# Patient Record
Sex: Female | Born: 1970 | Race: White | Hispanic: No | Marital: Single | Smoking: Former smoker
Health system: Southern US, Community
[De-identification: ages and names within clinical notes are randomized; demographics above are authoritative.]

## PROBLEM LIST (undated history)

## (undated) DIAGNOSIS — G709 Myoneural disorder, unspecified: Secondary | ICD-10-CM

## (undated) DIAGNOSIS — N809 Endometriosis, unspecified: Secondary | ICD-10-CM

## (undated) DIAGNOSIS — F329 Major depressive disorder, single episode, unspecified: Secondary | ICD-10-CM

## (undated) DIAGNOSIS — E079 Disorder of thyroid, unspecified: Secondary | ICD-10-CM

## (undated) DIAGNOSIS — F32A Depression, unspecified: Secondary | ICD-10-CM

## (undated) DIAGNOSIS — N189 Chronic kidney disease, unspecified: Secondary | ICD-10-CM

## (undated) HISTORY — DX: Major depressive disorder, single episode, unspecified: F32.9

## (undated) HISTORY — DX: Myoneural disorder, unspecified: G70.9

## (undated) HISTORY — PX: ABDOMINAL HYSTERECTOMY: SHX81

## (undated) HISTORY — DX: Depression, unspecified: F32.A

## (undated) HISTORY — DX: Disorder of thyroid, unspecified: E07.9

## (undated) HISTORY — DX: Chronic kidney disease, unspecified: N18.9

---

## 1998-02-03 ENCOUNTER — Inpatient Hospital Stay (HOSPITAL_COMMUNITY): Admission: AD | Admit: 1998-02-03 | Discharge: 1998-02-03 | Payer: Self-pay | Admitting: Obstetrics and Gynecology

## 1998-02-05 ENCOUNTER — Inpatient Hospital Stay (HOSPITAL_COMMUNITY): Admission: AD | Admit: 1998-02-05 | Discharge: 1998-02-05 | Payer: Self-pay | Admitting: Obstetrics & Gynecology

## 1998-02-06 ENCOUNTER — Ambulatory Visit (HOSPITAL_COMMUNITY): Admission: RE | Admit: 1998-02-06 | Discharge: 1998-02-06 | Payer: Self-pay | Admitting: Obstetrics and Gynecology

## 1998-03-07 ENCOUNTER — Inpatient Hospital Stay (HOSPITAL_COMMUNITY): Admission: AD | Admit: 1998-03-07 | Discharge: 1998-03-07 | Payer: Self-pay | Admitting: Obstetrics and Gynecology

## 1998-07-22 ENCOUNTER — Inpatient Hospital Stay (HOSPITAL_COMMUNITY): Admission: AD | Admit: 1998-07-22 | Discharge: 1998-07-22 | Payer: Self-pay | Admitting: Obstetrics and Gynecology

## 1998-07-23 ENCOUNTER — Inpatient Hospital Stay (HOSPITAL_COMMUNITY): Admission: AD | Admit: 1998-07-23 | Discharge: 1998-07-23 | Payer: Self-pay | Admitting: Obstetrics and Gynecology

## 1998-08-02 ENCOUNTER — Inpatient Hospital Stay (HOSPITAL_COMMUNITY): Admission: AD | Admit: 1998-08-02 | Discharge: 1998-08-05 | Payer: Self-pay | Admitting: Obstetrics and Gynecology

## 1998-09-02 ENCOUNTER — Other Ambulatory Visit: Admission: RE | Admit: 1998-09-02 | Discharge: 1998-09-02 | Payer: Self-pay | Admitting: Obstetrics and Gynecology

## 2002-07-13 ENCOUNTER — Emergency Department (HOSPITAL_COMMUNITY): Admission: EM | Admit: 2002-07-13 | Discharge: 2002-07-13 | Payer: Self-pay | Admitting: Emergency Medicine

## 2002-07-14 ENCOUNTER — Emergency Department (HOSPITAL_COMMUNITY): Admission: EM | Admit: 2002-07-14 | Discharge: 2002-07-14 | Payer: Self-pay | Admitting: Emergency Medicine

## 2002-07-14 ENCOUNTER — Encounter: Payer: Self-pay | Admitting: Emergency Medicine

## 2002-07-15 ENCOUNTER — Ambulatory Visit (HOSPITAL_COMMUNITY): Admission: EM | Admit: 2002-07-15 | Discharge: 2002-07-15 | Payer: Self-pay | Admitting: Emergency Medicine

## 2003-02-06 ENCOUNTER — Other Ambulatory Visit: Admission: RE | Admit: 2003-02-06 | Discharge: 2003-02-06 | Payer: Self-pay | Admitting: Obstetrics and Gynecology

## 2004-03-12 LAB — HM COLONOSCOPY

## 2004-07-22 ENCOUNTER — Other Ambulatory Visit: Admission: RE | Admit: 2004-07-22 | Discharge: 2004-07-22 | Payer: Self-pay | Admitting: Obstetrics and Gynecology

## 2010-09-30 LAB — HM MAMMOGRAPHY

## 2011-03-13 ENCOUNTER — Other Ambulatory Visit (INDEPENDENT_AMBULATORY_CARE_PROVIDER_SITE_OTHER): Payer: BLUE CROSS/BLUE SHIELD

## 2011-03-13 ENCOUNTER — Encounter: Payer: Self-pay | Admitting: Internal Medicine

## 2011-03-13 ENCOUNTER — Ambulatory Visit (INDEPENDENT_AMBULATORY_CARE_PROVIDER_SITE_OTHER): Payer: BLUE CROSS/BLUE SHIELD | Admitting: Internal Medicine

## 2011-03-13 VITALS — BP 108/70 | HR 81 | Temp 98.8°F | Ht 65.0 in | Wt 127.0 lb

## 2011-03-13 DIAGNOSIS — E0500 Thyrotoxicosis with diffuse goiter without thyrotoxic crisis or storm: Secondary | ICD-10-CM

## 2011-03-13 DIAGNOSIS — N390 Urinary tract infection, site not specified: Secondary | ICD-10-CM

## 2011-03-13 DIAGNOSIS — G2581 Restless legs syndrome: Secondary | ICD-10-CM

## 2011-03-13 DIAGNOSIS — F32A Depression, unspecified: Secondary | ICD-10-CM

## 2011-03-13 DIAGNOSIS — F3289 Other specified depressive episodes: Secondary | ICD-10-CM

## 2011-03-13 DIAGNOSIS — F329 Major depressive disorder, single episode, unspecified: Secondary | ICD-10-CM

## 2011-03-13 LAB — CBC WITH DIFFERENTIAL/PLATELET
Basophils Absolute: 0 10*3/uL (ref 0.0–0.1)
Eosinophils Absolute: 0.2 10*3/uL (ref 0.0–0.7)
MCHC: 34.2 g/dL (ref 30.0–36.0)
MCV: 93.4 fl (ref 78.0–100.0)
Monocytes Absolute: 0.4 10*3/uL (ref 0.1–1.0)
Neutrophils Relative %: 57.6 % (ref 43.0–77.0)
Platelets: 181 10*3/uL (ref 150.0–400.0)

## 2011-03-13 LAB — POCT URINALYSIS DIPSTICK
Bilirubin, UA: NEGATIVE
Glucose, UA: NEGATIVE
Ketones, UA: NEGATIVE
Nitrite, UA: NEGATIVE
Protein, UA: NEGATIVE
pH, UA: 6.5

## 2011-03-13 LAB — COMPREHENSIVE METABOLIC PANEL
AST: 21 U/L (ref 0–37)
Albumin: 4.1 g/dL (ref 3.5–5.2)
Alkaline Phosphatase: 58 U/L (ref 39–117)
Calcium: 9.1 mg/dL (ref 8.4–10.5)
Chloride: 104 mEq/L (ref 96–112)
Glucose, Bld: 80 mg/dL (ref 70–99)
Potassium: 3.8 mEq/L (ref 3.5–5.1)
Sodium: 141 mEq/L (ref 135–145)
Total Protein: 7.4 g/dL (ref 6.0–8.3)

## 2011-03-13 MED ORDER — DULOXETINE HCL 30 MG PO CPEP: 30.0000 mg | ORAL_CAPSULE | Freq: Every day | ORAL | Status: DC

## 2011-03-13 MED ORDER — GABAPENTIN ENACARBIL ER 600 MG PO TBCR: 1.0000 | EXTENDED_RELEASE_TABLET | Freq: Every evening | ORAL | Status: AC | PRN

## 2011-03-13 NOTE — Patient Instructions (Signed)
Depression, Adolescent and Adult Depression is a true and treatable medical condition. In general there are two kinds of depression:  Depression we all experience in some form. For example depression from the death of a loved one, financial distress or natural disasters will trigger or increase depression.   Clinical depression, on the other hand, appears without an apparent cause or reason. This depression is a disease. Depression may be caused by chemical imbalance in the body and brain or may come as a response to a physical illness. Alcohol and other drugs can cause depression.  DIAGNOSIS  The diagnosis of depression is usually based upon symptoms and medical history. TREATMENT Treatments for depression fall into three categories. These are:  Drug therapy. There are many medicines that treat depression. Responses may vary and sometimes trial and error is necessary to determine the best medicines and dosage for a particular patient.   Psychotherapy, also called talking treatments, helps people resolve their problems by looking at them from a different point of view and by giving people insight into their own personal makeup. Traditional psychotherapy looks at a childhood source of a problem. Other psychotherapy will look at current conflicts and move toward solving those. If the cause of depression is drug use, counseling is available to help abstain. In time the depression will usually improve. If there were underlying causes for the chemical use, they can be addressed.   ECT (electroconvulsive therapy) or shock treatment is not as commonly used today. It is a very effective treatment for severe suicidal depression. During ECT electrical impulses are applied to the head. These impulses cause a generalized seizure. It can be effective but causes a loss of memory for recent events. Sometimes this loss of memory may include the last several months.  Treat all depression or suicide threats as  serious. Obtain professional help. Do not wait to see if serious depression will get better over time without help. Seek help for yourself or those around you. In the [place] the number to the National Suicide Help Lines With 24 Hour Help Are: 1-800-SUICIDE 570 095 9221 Document Released: 11/13/2000 Document Re-Released: 02/12/2009 [facility] Patient Information 2011 [place], [place].Restless Legs Syndrome Restless legs syndrome is a sensori-motor (movement) disorder. The symptoms include uncomfortable sensations in the legs. These leg sensations are worse during periods of inactivity or rest. They are also worse while sitting or lying down. There is often a positive family history of the disorder. Individuals that have the disorder describe sensations of:  Pulling.  Drawing.   Crawling.   Worming.  Boring.   Tingling.   Pins and Needles.  Prickly.   Painful.   The sensations are usually accompanied by an overwhelming urge to move the legs. Sudden muscle jerks may also occur. Movement provides temporary relief from the discomfort. In rare cases, the arms may also be affected. Symptoms may interfere with sleep onset (sleep onset insomnia). Research suggests that restless legs syndrome is related to periodic limb movement disorder (PLMD). PLMD is another more common motor disorder. It also causes interrupted sleep. The symptoms often exhibit circadian rhythmicity in their peak occurrence during awakening hours. TREATMENT Treatment for restless legs syndrome is symptomatic. This means that the symptoms are treated. Massage and application of cold compresses may provide temporary relief. Medications effective in relieving the symptoms include:  Temazepam.  Levodopa/Carbidopa.   Bromocriptine.  Pergolide Mesylate.   Oxycodone.  Propoxyphene.   Codeine.  However, many of these medications have side effects. Current research suggests correction of  iron deficiency may improve symptoms for  some patients. PROGNOSIS Restless legs syndrome is a life-long condition. There is no cure. Symptoms may gradually worsen with age. The most disabling feature is the sleep onset insomnia. It can be severe. Document Released: 11/06/2002 Document Re-Released: 05/06/2010 [facility] Patient Information 2011 [place], [place].

## 2011-03-15 ENCOUNTER — Encounter: Payer: Self-pay | Admitting: Internal Medicine

## 2011-03-15 LAB — CULTURE, URINE COMPREHENSIVE
Colony Count: NO GROWTH
Organism ID, Bacteria: NO GROWTH

## 2011-03-15 MED ORDER — PROPYLTHIOURACIL 50 MG PO TABS: 50.0000 mg | ORAL_TABLET | Freq: Every day | ORAL | Status: AC

## 2011-03-15 NOTE — Progress Notes (Signed)
Subjective:    Patient ID: Katelyn Davis, female    DOB: January 31, 1971, 40 y.o.   MRN: [id number]  HPI New to me she needs a f/up on Grave's disease which was diagnosed by Thyroid scan and labs one year ago in [place]. She fells like she is doing well on PTU.  She thinks she may have a UTI with sone dysuria and frequency.  She has been under a lot of stress with a recent divorce and she feels persistently sad and depressed.  Also, she thinks he RLS as her legs twitch, jerk and move all night long. Sometimes they jerk so hard that it wakes her up during the night.    Review of Systems  Constitutional: Negative for fever, chills, diaphoresis, activity change, appetite change, fatigue and unexpected weight change.  HENT: Negative for sore throat, facial swelling, trouble swallowing, neck pain, neck stiffness and voice change.   Eyes: Negative for discharge, redness and itching.  Respiratory: Negative for apnea, cough, choking, chest tightness, shortness of breath, wheezing and stridor.   Cardiovascular: Negative for chest pain, palpitations and leg swelling.  Gastrointestinal: Negative for nausea, vomiting, abdominal pain, diarrhea, constipation, blood in stool and abdominal distention.  Genitourinary: Positive for dysuria and frequency. Negative for urgency, hematuria, flank pain, decreased urine volume, vaginal bleeding, vaginal discharge, enuresis, difficulty urinating, genital sores, vaginal pain, pelvic pain and dyspareunia.  Musculoskeletal: Negative for myalgias, back pain, joint swelling, arthralgias and gait problem.  Skin: Negative for color change, pallor, rash and wound.  Neurological: Negative for dizziness, tremors, seizures, syncope, facial asymmetry, speech difficulty, weakness, light-headedness, numbness and headaches.  Hematological: Negative for adenopathy. Does not bruise/bleed easily.  Psychiatric/Behavioral: Positive for dysphoric mood. Negative for suicidal ideas,  hallucinations, behavioral problems, confusion, sleep disturbance, self-injury, decreased concentration and agitation. The patient is nervous/anxious. The patient is not hyperactive.        Objective:   Physical Exam  Constitutional: She is oriented to person, place, and time. She appears well-developed and well-nourished. No distress.  HENT:  Head: Normocephalic.  Right Ear: External ear normal.  Left Ear: External ear normal.  Nose: Nose normal.  Mouth/Throat: Oropharynx is clear and moist. No oropharyngeal exudate.  Eyes: Conjunctivae and EOM are normal. Pupils are equal, round, and reactive to light. Right eye exhibits no discharge. Left eye exhibits no discharge. No scleral icterus.  Neck: Normal range of motion. Neck supple. No JVD present. No tracheal tenderness present. Carotid bruit is not present. No tracheal deviation present. No mass and no thyromegaly present.  Cardiovascular: Normal rate, regular rhythm, normal heart sounds and intact distal pulses.  Exam reveals no gallop and no friction rub.   No murmur heard. Pulmonary/Chest: Effort normal and breath sounds normal. No stridor. No respiratory distress. She has no wheezes. She has no rales. She exhibits no tenderness.  Abdominal: Soft. Bowel sounds are normal. She exhibits no distension and no mass. There is no tenderness. There is no rebound and no guarding.  Musculoskeletal: Normal range of motion. She exhibits no edema and no tenderness.  Lymphadenopathy:    She has no cervical adenopathy.  Neurological: She is alert and oriented to person, place, and time. She has normal reflexes. No cranial nerve deficit.  Skin: Skin is warm and dry. No rash noted. She is not diaphoretic. No erythema.  Psychiatric: Her speech is normal and behavior is normal. Judgment and thought content normal. Her mood appears anxious. Her affect is not angry, not blunt, not labile and not  inappropriate. She is not agitated, not aggressive, is not  hyperactive and not combative. Thought content is not paranoid and not delusional. Cognition and memory are normal. Cognition and memory are not impaired. She does not express impulsivity or inappropriate judgment. She exhibits a depressed mood. She expresses no homicidal and no suicidal ideation. She exhibits normal recent memory and normal remote memory.       She is tearful and sad.          Assessment & Plan:

## 2011-03-15 NOTE — Assessment & Plan Note (Signed)
I will check her TFT's and look for any toxic effects from PTU therapy

## 2011-03-16 ENCOUNTER — Encounter: Payer: Self-pay | Admitting: Internal Medicine

## 2011-03-16 DIAGNOSIS — N390 Urinary tract infection, site not specified: Secondary | ICD-10-CM | POA: Insufficient documentation

## 2011-03-16 NOTE — Assessment & Plan Note (Signed)
Start cymbalta, she is not willing to start psychotherapy at this time

## 2011-03-16 NOTE — Assessment & Plan Note (Signed)
I will check a urine culture and treat if needed

## 2011-03-16 NOTE — Assessment & Plan Note (Signed)
Try horizant, check labs to look for secondary causes

## 2011-03-20 ENCOUNTER — Ambulatory Visit: Payer: Self-pay | Admitting: Internal Medicine

## 2011-03-25 ENCOUNTER — Ambulatory Visit: Payer: Self-pay | Admitting: Internal Medicine

## 2011-04-13 ENCOUNTER — Ambulatory Visit: Payer: BLUE CROSS/BLUE SHIELD | Admitting: Internal Medicine

## 2011-04-17 NOTE — Op Note (Signed)
   TNAMELAURISA, SAHAKIAN                        ACCOUNT NO.:  [id number]   MEDICAL RECORD NO.:  [id number]                   PATIENT TYPE:  EMS   LOCATION:  ED                                   FACILITY:  [facility]   PHYSICIAN:  Maretta Bees. Vonita Moss, M.D.             DATE OF BIRTH:  1971-02-08   DATE OF PROCEDURE:  07/15/2002  DATE OF DISCHARGE:                                 OPERATIVE REPORT   PREOPERATIVE DIAGNOSIS:  Left upper ureteral stone with severe pain and  obstruction.   POSTOPERATIVE DIAGNOSIS:  Left upper ureteral stone with severe pain and  obstruction.   PROCEDURE:  1. Cystoscopy.  2. Left ureteroscopy with hormion laser fragmentation of stone.  3. Left retrograde pyelogram with interpretation.  4. Left double-J catheter placement.   SURGEON:  Maretta Bees. Vonita Moss, M.D.   ANESTHESIA:  General.   INDICATIONS:  This 40 year old white female has had severe left flank pain,  as noted in the ER note, due to the 5 mm stone in the mid upper ureter on  the left.  She requested endoscopic intervention at this time in view of her  severe pain and persistent problems, including nausea and vomiting.   DESCRIPTION OF PROCEDURE:  The patient was brought to operating room and  placed in lithotomy position.  The external genitalia were prepped and  draped in the usual fashion.  She was cystoscoped, and the bladder was  unremarkable.  A guidewire was passed up to the kidney with just some slight  hang up as it passed by the stone.  A ureteral dilating sheath was inserted,  and then the flexible ureteroscope inserted, and there was a lot of edema  and narrowing where the stone was located.  I was able to get above stone  and then using a hormion laser, fragmented the stone into several small  pieces.  I then passed the stone basket, but the fragments were too small to  engage in the basket.  I then injected contrast and outlined a somewhat  dilated pyelocaliceal system but an intact  upper ureter.  The guidewire was  backloaded through the cystoscope, and a 6 French 26 cm double-J catheter  placed with a full coil in the renal collecting system and a full coil in  the bladder and string brought out per urethra.  She was then taken to the  recovery room in good condition, having tolerated the procedure well.                                               Maretta Bees. Vonita Moss, M.D.    LJP/MEDQ  D:  07/15/2002  T:  07/15/2002  Job:  [id number]

## 2011-04-17 NOTE — H&P (Signed)
   Katelyn Davis, Katelyn Davis                         ACCOUNT NO.:  [id number]   MEDICAL RECORD NO.:  [id number]                   PATIENT TYPE:  EMS   LOCATION:  ED                                   FACILITY:  [facility]   PHYSICIAN:  Maretta Bees. Vonita Moss, M.D.             DATE OF BIRTH:  [birth year]   DATE OF ADMISSION:  07/15/2002  DATE OF DISCHARGE:                                HISTORY & PHYSICAL   HISTORY OF PRESENT ILLNESS:  This 40 year old white female was seen in the  emergency room here in the last two days for evaluation of severe left  abdominal pain.  The CT scan showed a 5 mm stone mid proximal ureter with  obstruction, and there was also felt to be another 3 to 4 mm stone in the  left kidney.  She was sent home on oxycodone, but she was so nauseated that  she could not keep the pain medication in.  When she called me this morning,  I told her to go back to the emergency room and I would see her there.   PAST MEDICAL HISTORY:  She is in generally good health.   MEDICATIONS:  She is on no regular medications.   ALLERGIES:  Denied.   She has had no stones before.   PHYSICAL EXAMINATION:  She was in extreme distress with severe left flank  pain, guarding in the left flank, but otherwise alert and healthy.   LABORATORY DATA:  A pregnancy test on 07/13/02, was negative.  She has had no  sexual activity since then.  __________ has been by isolation versus  endoscopic intervention.  They want to go ahead at this time, even as  opposed for waiting for ESL on Monday, 07/17/02.  The patient was advised of  ablation with Holmium laser and a double J catheter, and risks of  perforation etc.                                               Maretta Bees. Vonita Moss, M.D.    LJP/MEDQ  D:  07/15/2002  T:  07/15/2002  Job:  [id number]

## 2011-04-22 ENCOUNTER — Ambulatory Visit: Payer: BLUE CROSS/BLUE SHIELD | Admitting: Internal Medicine

## 2011-07-13 ENCOUNTER — Other Ambulatory Visit: Payer: Self-pay | Admitting: *Deleted

## 2011-07-13 DIAGNOSIS — F329 Major depressive disorder, single episode, unspecified: Secondary | ICD-10-CM

## 2011-07-13 MED ORDER — DULOXETINE HCL 30 MG PO CPEP: 30.0000 mg | ORAL_CAPSULE | Freq: Every day | ORAL | Status: AC

## 2011-07-13 MED ORDER — DULOXETINE HCL 30 MG PO CPEP: 30.0000 mg | ORAL_CAPSULE | Freq: Every day | ORAL | Status: DC

## 2011-07-13 NOTE — Progress Notes (Signed)
RX rf completed for mail order & samples avail for pick up. Pt aware

## 2011-09-14 ENCOUNTER — Ambulatory Visit: Payer: BLUE CROSS/BLUE SHIELD | Admitting: Internal Medicine

## 2011-09-14 DIAGNOSIS — Z0289 Encounter for other administrative examinations: Secondary | ICD-10-CM

## 2013-04-06 ENCOUNTER — Ambulatory Visit: Payer: BLUE CROSS/BLUE SHIELD | Admitting: Internal Medicine

## 2013-04-06 DIAGNOSIS — Z0289 Encounter for other administrative examinations: Secondary | ICD-10-CM

## 2013-04-11 ENCOUNTER — Ambulatory Visit: Payer: BLUE CROSS/BLUE SHIELD | Admitting: Family Medicine

## 2013-04-17 ENCOUNTER — Ambulatory Visit: Payer: BLUE CROSS/BLUE SHIELD | Admitting: Internal Medicine

## 2013-04-17 DIAGNOSIS — Z0289 Encounter for other administrative examinations: Secondary | ICD-10-CM

## 2015-03-28 ENCOUNTER — Other Ambulatory Visit: Payer: Self-pay

## 2018-01-24 ENCOUNTER — Other Ambulatory Visit: Payer: Self-pay

## 2018-01-28 LAB — CYTOLOGY - PAP: DIAGNOSIS: NEGATIVE

## 2018-10-21 ENCOUNTER — Other Ambulatory Visit (HOSPITAL_COMMUNITY): Payer: Self-pay | Admitting: *Deleted

## 2018-10-21 DIAGNOSIS — Z1231 Encounter for screening mammogram for malignant neoplasm of breast: Secondary | ICD-10-CM

## 2019-01-26 ENCOUNTER — Ambulatory Visit
Admission: RE | Admit: 2019-01-26 | Discharge: 2019-01-26 | Disposition: A | Discharge status: Home / Self Care | Payer: No Typology Code available for payment source | Source: Ambulatory Visit | Attending: Obstetrics and Gynecology | Admitting: Obstetrics and Gynecology

## 2019-01-26 ENCOUNTER — Ambulatory Visit (HOSPITAL_COMMUNITY)
Admission: RE | Admit: 2019-01-26 | Discharge: 2019-01-26 | Disposition: A | Discharge status: Home / Self Care | Payer: Self-pay | Source: Ambulatory Visit | Attending: Obstetrics and Gynecology | Admitting: Obstetrics and Gynecology

## 2019-01-26 ENCOUNTER — Encounter (HOSPITAL_COMMUNITY): Payer: Self-pay

## 2019-01-26 VITALS — BP 112/74

## 2019-01-26 DIAGNOSIS — Z1231 Encounter for screening mammogram for malignant neoplasm of breast: Secondary | ICD-10-CM

## 2019-01-26 DIAGNOSIS — Z1239 Encounter for other screening for malignant neoplasm of breast: Secondary | ICD-10-CM

## 2019-01-26 HISTORY — DX: Endometriosis, unspecified: N80.9

## 2019-01-26 NOTE — Progress Notes (Signed)
No complaints today.   Pap Smear: Pap smear not completed today. Last Pap smear was 01/24/2018 at the free cervical cancer screening at the [facility] and normal. Per patient has no history of an abnormal Pap smear. Patient has a history of a supracervical hysterectomy 3 years ago due to endometriosis. Last Pap smear result is in Epic.  Physical exam: Breasts Breasts symmetrical. No skin abnormalities bilateral breasts. No nipple retraction bilateral breasts. No nipple discharge bilateral breasts. No lymphadenopathy. No lumps palpated bilateral breasts. No complaints of pain or tenderness on exam. Referred patient to the [facility] for a screening mammogram. Appointment scheduled for Thursday, January 26, 2019 at 1530.        Pelvic/Bimanual No Pap smear completed today since last Pap smear was 01/24/2018. Pap smear not indicated per BCCCP guidelines.   Smoking History: Patient is a former smoker that quit in March 2018.  Patient Navigation: Patient education provided. Access to services provided for patient through BCCCP program.   Breast and Cervical Cancer Risk Assessment: Patient has no family history of breast cancer, known genetic mutations, or radiation treatment to the chest before age 89. Patient has no history of cervical dysplasia, immunocompromised, or DES exposure in-utero.  Risk Assessment    Risk Scores      01/26/2019   Last edited by: Lynnell Dike, LPN   5-year risk: 0.8 %   Lifetime risk: 8.5 %

## 2019-01-26 NOTE — Patient Instructions (Signed)
Explained breast self awareness with Katelyn Davis. Patient did not need a Pap smear today due to last Pap smear was 01/24/2018. Let her know BCCCP will cover Pap smears every 3 years unless has a history of abnormal Pap smears. Referred patient to the [facility] for a screening mammogram. Appointment scheduled for Thursday, January 26, 2019 at 1530. Patient aware of appointment and will be there. Let patient know the Breast Center will follow up with her within the next couple weeks with results of mammogram by letter or phone. Katelyn Davis verbalized understanding.  [place], Kathaleen Maser, RN 2:12 PM

## 2019-02-01 ENCOUNTER — Encounter (HOSPITAL_COMMUNITY): Payer: Self-pay | Admitting: *Deleted

## 2019-06-21 ENCOUNTER — Other Ambulatory Visit: Payer: Self-pay

## 2019-06-21 DIAGNOSIS — Z20822 Contact with and (suspected) exposure to covid-19: Secondary | ICD-10-CM

## 2019-06-25 LAB — NOVEL CORONAVIRUS, NAA: SARS-CoV-2, NAA: NOT DETECTED

## 2019-06-28 ENCOUNTER — Telehealth: Payer: Self-pay

## 2019-06-28 NOTE — Telephone Encounter (Signed)
Advised patient her COVID 19 test results are negative. °

## 2020-08-20 ENCOUNTER — Other Ambulatory Visit: Payer: Self-pay | Admitting: Obstetrics and Gynecology

## 2020-08-20 DIAGNOSIS — Z1231 Encounter for screening mammogram for malignant neoplasm of breast: Secondary | ICD-10-CM

## 2020-09-06 ENCOUNTER — Other Ambulatory Visit: Payer: Self-pay | Admitting: Obstetrics and Gynecology

## 2020-09-06 ENCOUNTER — Ambulatory Visit
Admission: RE | Admit: 2020-09-06 | Discharge: 2020-09-06 | Disposition: A | Discharge status: Home / Self Care | Payer: 59 | Source: Ambulatory Visit | Attending: Obstetrics and Gynecology | Admitting: Obstetrics and Gynecology

## 2020-09-06 ENCOUNTER — Other Ambulatory Visit: Payer: Self-pay

## 2020-09-06 DIAGNOSIS — Z1231 Encounter for screening mammogram for malignant neoplasm of breast: Secondary | ICD-10-CM

## 2020-09-12 ENCOUNTER — Other Ambulatory Visit: Payer: Self-pay | Admitting: Obstetrics and Gynecology

## 2020-09-12 DIAGNOSIS — R928 Other abnormal and inconclusive findings on diagnostic imaging of breast: Secondary | ICD-10-CM

## 2020-09-16 ENCOUNTER — Ambulatory Visit
Admission: RE | Admit: 2020-09-16 | Discharge: 2020-09-16 | Disposition: A | Discharge status: Home / Self Care | Payer: 59 | Source: Ambulatory Visit | Attending: Obstetrics and Gynecology | Admitting: Obstetrics and Gynecology

## 2020-09-16 ENCOUNTER — Other Ambulatory Visit: Payer: Self-pay

## 2020-09-16 DIAGNOSIS — R928 Other abnormal and inconclusive findings on diagnostic imaging of breast: Secondary | ICD-10-CM

## 2020-09-20 ENCOUNTER — Other Ambulatory Visit: Payer: 59

## 2021-10-01 ENCOUNTER — Other Ambulatory Visit: Payer: Self-pay | Admitting: Physician Assistant

## 2021-10-01 DIAGNOSIS — Z1231 Encounter for screening mammogram for malignant neoplasm of breast: Secondary | ICD-10-CM

## 2021-11-06 ENCOUNTER — Ambulatory Visit: Payer: 59

## 2021-12-08 ENCOUNTER — Ambulatory Visit: Payer: 59

## 2021-12-24 ENCOUNTER — Ambulatory Visit: Payer: 59

## 2022-01-07 ENCOUNTER — Ambulatory Visit: Payer: 59

## 2022-01-23 ENCOUNTER — Ambulatory Visit: Payer: 59

## 2022-03-12 ENCOUNTER — Ambulatory Visit: Payer: 59

## 2022-03-13 ENCOUNTER — Ambulatory Visit: Payer: 59

## 2022-03-16 ENCOUNTER — Ambulatory Visit
Admission: RE | Admit: 2022-03-16 | Discharge: 2022-03-16 | Disposition: A | Discharge status: Home / Self Care | Payer: 59 | Source: Ambulatory Visit | Attending: Physician Assistant | Admitting: Physician Assistant

## 2022-03-16 ENCOUNTER — Ambulatory Visit: Payer: 59

## 2022-03-16 DIAGNOSIS — Z1231 Encounter for screening mammogram for malignant neoplasm of breast: Secondary | ICD-10-CM

## 2023-03-24 ENCOUNTER — Encounter: Payer: Self-pay | Admitting: Urology

## 2023-03-24 ENCOUNTER — Ambulatory Visit (INDEPENDENT_AMBULATORY_CARE_PROVIDER_SITE_OTHER): Payer: Medicaid Other | Admitting: Urology

## 2023-03-24 ENCOUNTER — Ambulatory Visit (HOSPITAL_BASED_OUTPATIENT_CLINIC_OR_DEPARTMENT_OTHER)
Admission: RE | Admit: 2023-03-24 | Discharge: 2023-03-24 | Disposition: A | Discharge status: Home / Self Care | Payer: Medicaid Other | Source: Ambulatory Visit | Attending: Urology | Admitting: Urology

## 2023-03-24 VITALS — BP 151/82 | HR 86 | Ht 65.0 in | Wt 128.0 lb

## 2023-03-24 DIAGNOSIS — R350 Frequency of micturition: Secondary | ICD-10-CM

## 2023-03-24 DIAGNOSIS — R82998 Other abnormal findings in urine: Secondary | ICD-10-CM | POA: Diagnosis not present

## 2023-03-24 DIAGNOSIS — N200 Calculus of kidney: Secondary | ICD-10-CM

## 2023-03-24 DIAGNOSIS — R109 Unspecified abdominal pain: Secondary | ICD-10-CM | POA: Diagnosis not present

## 2023-03-24 DIAGNOSIS — N3000 Acute cystitis without hematuria: Secondary | ICD-10-CM

## 2023-03-24 LAB — MICROSCOPIC EXAMINATION
Cast Type: NONE SEEN
Casts: NONE SEEN /lpf
Crystal Type: NONE SEEN
Crystals: NONE SEEN
Mucus, UA: NONE SEEN
RBC, Urine: NONE SEEN /hpf (ref 0–2)
Trichomonas, UA: NONE SEEN
WBC, UA: >30 /hpf — AB (ref 0–5)
Yeast, UA: NONE SEEN

## 2023-03-24 LAB — URINALYSIS, ROUTINE W REFLEX MICROSCOPIC
Bilirubin, UA: NEGATIVE
Glucose, UA: NEGATIVE
Ketones, UA: NEGATIVE
Nitrite, UA: NEGATIVE
Protein,UA: NEGATIVE
Specific Gravity, UA: 1.01 (ref 1.005–1.030)
Urobilinogen, Ur: 0.2 mg/dL (ref 0.2–1.0)
pH, UA: 6.5 (ref 5.0–7.5)

## 2023-03-24 MED ORDER — CEFDINIR 300 MG PO CAPS: 300.0000 mg | ORAL_CAPSULE | Freq: Two times a day (BID) | ORAL | 0 refills | Status: DC

## 2023-03-24 NOTE — Progress Notes (Signed)
   Assessment: 1. Nephrolithiasis   2. Acute cystitis without hematuria      Plan: Schedule CT stone study as soon as possible Urine today for culture Will begin treatment empirically with Omnicef 300 twice daily for 5 days Patient will follow-up after CT stone study for further evaluation and recommendations.  Chief Complaint: kidney stone  History of Present Illness:  Katelyn Davis is a 52 y.o. female who is seen in consultation from [place], [place] for evaluation of nephrolithiasis. Patient has a prior history of kidney stones having had 3 episodes in the past 1 requiring endoscopic management approximately 15 to 20 years ago.  Patient recently apparently had a chest x-ray that showed a stone within the right kidney.  She has not had a CT stone study.  Currently she reports some increased urinary frequency and her urinalysis today is consistent with UTI.  She denies any fever or constitutional complaints. Not currently having stone pain.   Past Medical History:  Past Medical History:  Diagnosis Date   Chronic kidney disease    stones   Depression    Endometriosis    Neuromuscular disorder    migraines   Thyroid disease     Past Surgical History:  Past Surgical History:  Procedure Laterality Date   ABDOMINAL HYSTERECTOMY      Allergies:  No Known Allergies  Family History:  Family History  Problem Relation Age of Onset   Stroke Mother    Hyperlipidemia Father    Hypertension Father    Breast cancer Neg Hx     Social History:  Social History   Tobacco Use   Smoking status: Former    Types: Cigarettes    Quit date: 01/28/2017    Years since quitting: 6.1   Smokeless tobacco: Never  Vaping Use   Vaping Use: Never used  Substance Use Topics   Alcohol use: No   Drug use: No    Review of symptoms:  Constitutional:  Negative for unexplained weight loss, night sweats, fever, chills ENT:  Negative for nose bleeds, sinus pain, painful  swallowing CV:  Negative for chest pain, shortness of breath, exercise intolerance, palpitations, loss of consciousness Resp:  Negative for cough, wheezing, shortness of breath GI:  Negative for nausea, vomiting, diarrhea, bloody stools GU:  Positives noted in HPI; otherwise negative for gross hematuria, dysuria, urinary incontinence Neuro:  Negative for seizures, poor balance, limb weakness, slurred speech Psych:  Negative for lack of energy, depression, anxiety Endocrine:  Negative for polydipsia, polyuria, symptoms of hypoglycemia (dizziness, hunger, sweating) Hematologic:  Negative for anemia, purpura, petechia, prolonged or excessive bleeding, use of anticoagulants  Allergic:  Negative for difficulty breathing or choking as a result of exposure to anything; no shellfish allergy; no allergic response (rash/itch) to materials, foods  Physical exam: BP (!) 151/82   Pulse 86   Ht  (1.651 m)   Wt 128 lb (58.1 kg)   LMP 02/20/2011   BMI 21.30 kg/m  GENERAL APPEARANCE:  Well appearing, well developed, well nourished, NAD   Results: UA consistent with uti

## 2023-03-24 NOTE — Addendum Note (Signed)
Addended by: Carolin Coy on: 03/24/2023 12:17 PM   Modules accepted: Orders

## 2023-03-29 LAB — URINE CULTURE

## 2023-03-30 ENCOUNTER — Other Ambulatory Visit: Payer: Self-pay | Admitting: Urology

## 2023-03-30 ENCOUNTER — Telehealth: Payer: Self-pay

## 2023-03-30 MED ORDER — AMOXICILLIN 875 MG PO TABS: 875.0000 mg | ORAL_TABLET | Freq: Two times a day (BID) | ORAL | 0 refills | Status: AC

## 2023-03-30 NOTE — Telephone Encounter (Signed)
-----   Message from Joline Maxcy, MD sent at 03/30/2023  9:19 AM EDT ----- Regarding: antibiotic change Please call her asap and let her know that we need to change her antibiotic.  I sent rx in for amoxicillin.  She has appt tomorrow to see me as well. thanks

## 2023-03-30 NOTE — Telephone Encounter (Signed)
Left msg for a return call from patient regarding change in med.

## 2023-03-31 ENCOUNTER — Other Ambulatory Visit: Payer: Self-pay | Admitting: Urology

## 2023-03-31 ENCOUNTER — Telehealth: Payer: Self-pay | Admitting: Urology

## 2023-03-31 ENCOUNTER — Ambulatory Visit (INDEPENDENT_AMBULATORY_CARE_PROVIDER_SITE_OTHER): Payer: Medicaid Other | Admitting: Urology

## 2023-03-31 ENCOUNTER — Encounter: Payer: 59 | Admitting: Urology

## 2023-03-31 VITALS — BP 110/69 | HR 80

## 2023-03-31 DIAGNOSIS — N200 Calculus of kidney: Secondary | ICD-10-CM | POA: Diagnosis not present

## 2023-03-31 DIAGNOSIS — N390 Urinary tract infection, site not specified: Secondary | ICD-10-CM | POA: Diagnosis not present

## 2023-03-31 MED ORDER — NITROFURANTOIN MACROCRYSTAL 100 MG PO CAPS: 100.0000 mg | ORAL_CAPSULE | Freq: Every day | ORAL | 2 refills | Status: DC

## 2023-03-31 NOTE — Telephone Encounter (Signed)
Patient called and LVM during lunch that 2 antibiotics were supposed to be called in to pharmacy listed below and she wanted to make sure they were sent to the correct pharmacy.   [id number] [place], [place] - [address] Phone: [phone]  Fax: [phone]       Patients callback #: [phone]

## 2023-03-31 NOTE — Progress Notes (Signed)
Assessment: 1. Nephrolithiasis     Plan: Patient will begin amoxicillin 875 twice daily for 1 week.  She will then begin low-dose suppression with Macrodantin 100 mg nightly. Will review imaging regarding possible PCNL versus possible stent placement and ESL.  Will contact patient for further recommendations.  Chief Complaint: KIDNEY STONE  HPI: Katelyn Davis is a 52 y.o. female who presents for continued evaluation of NEPHROLITHIASIS AND UTI. Urine culture grew Enterococcus and her antibiotic has been changed to amoxicillin.  She will pick that up today. Today also reviewed her CT stone study that shows significant bilateral stone burden including a left-sided 1.7 cm upper pole staghorn calculus.  Suspect infected stone.  Patient has a prior history of kidney stones having had 3 episodes in the past 1 requiring endoscopic management approximately 15 to 20 years ago.  Patient recently apparently had a chest x-ray that showed a stone within the right kidney.  She has not had a CT stone study.  Currently she reports some increased urinary frequency and her urinalysis today is consistent with UTI.  She denies any fever or constitutional complaints. Not currently having stone pain. Portions of the above documentation were copied from a prior visit for review purposes only.  Allergies: No Known Allergies  PMH: Past Medical History:  Diagnosis Date   Chronic kidney disease    stones   Depression    Endometriosis    Neuromuscular disorder (HCC)    migraines   Thyroid disease     PSH: Past Surgical History:  Procedure Laterality Date   ABDOMINAL HYSTERECTOMY      SH: Social History   Tobacco Use   Smoking status: Former    Types: Cigarettes    Quit date: 01/28/2017    Years since quitting: 6.1   Smokeless tobacco: Never  Vaping Use   Vaping Use: Never used  Substance Use Topics   Alcohol use: No   Drug use: No    ROS: Constitutional:  Negative for fever,  chills, weight loss CV: Negative for chest pain, previous MI, hypertension Respiratory:  Negative for shortness of breath, wheezing, sleep apnea, frequent cough GI:  Negative for nausea, vomiting, bloody stool, GERD  PE: BP 110/69   Pulse 80   LMP 02/20/2011  GENERAL APPEARANCE:  Well appearing, well developed, well nourished, NAD    Results: CT STONE STUDY 03/24/23--- CLINICAL DATA:  Flank pain.  UTI.   EXAM: CT ABDOMEN AND PELVIS WITHOUT CONTRAST   TECHNIQUE: Multidetector CT imaging of the abdomen and pelvis was performed following the standard protocol without IV contrast.   RADIATION DOSE REDUCTION: This exam was performed according to the departmental dose-optimization program which includes automated exposure control, adjustment of the mA and/or kV according to patient size and/or use of iterative reconstruction technique.   COMPARISON:  Report of the study of 2003   FINDINGS: Lower chest: Lung bases are clear.  No pleural effusion.   Hepatobiliary: No focal liver abnormality is seen. No gallstones, gallbladder wall thickening, or biliary dilatation.   Pancreas: Unremarkable. No pancreatic ductal dilatation or surrounding inflammatory changes.   Spleen: Normal in size without focal abnormality.   Adrenals/Urinary Tract: The adrenal glands are preserved. No ureteral stones. Bilateral renal stones are seen which are nonobstructive. This includes a lower pole focus on the right measuring 13 mm, staghorn stone pole of the left measuring 17 mm, multiple smaller stones elsewhere throughout the left kidney. Preserved contours of the urinary bladder.   Stomach/Bowel:  Mild scattered colonic stool. Large bowel is of normal course and caliber. Small bowel is nondilated. Stomach is nondilated. No free air. Normal appendix seen extending medial to the cecum in the right lower quadrant and posterior. Of note there is some high attenuation material in the loops of colon.    Vascular/Lymphatic: No significant vascular findings are present. No enlarged abdominal or pelvic lymph nodes.   Reproductive: Status post hysterectomy. No adnexal masses.   Other: No abdominal wall hernia or abnormality. No abdominopelvic ascites.   Musculoskeletal: Mild curvature of the spine   IMPRESSION: Bilateral nonobstructing renal stones. 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 Other prominent stones as well. No ureteral stones.   No bowel obstruction, free air or free fluid.  Normal appendix.

## 2023-04-01 ENCOUNTER — Encounter: Payer: Self-pay | Admitting: Urology

## 2023-04-01 ENCOUNTER — Other Ambulatory Visit: Payer: Self-pay

## 2023-04-01 MED ORDER — NITROFURANTOIN MACROCRYSTAL 100 MG PO CAPS: 100.0000 mg | ORAL_CAPSULE | Freq: Every day | ORAL | 2 refills | Status: AC

## 2023-04-01 NOTE — Addendum Note (Signed)
Addended by: Joline Maxcy on: 04/01/2023 02:49 PM   Modules accepted: Orders

## 2023-04-01 NOTE — Telephone Encounter (Signed)
Sent prescription to the correct pharmacy.

## 2023-04-05 ENCOUNTER — Telehealth: Payer: Self-pay | Admitting: Urology

## 2023-04-05 NOTE — Telephone Encounter (Signed)
Patient called back and stated that [facility] can not see her until June and wants her referral + CT scan + records sent over to [facility] (FAX #:[phone] ATTN KENDRA HOGAN). She said put on there that it is urgent and they can get her in sooner.  Patients callback #: [phone]

## 2023-04-05 NOTE — Telephone Encounter (Signed)
Patient called and left a voicemail that she would like a return call back regarding Dr Margo Aye was supposed to send her a referral to someone else , another Urologist, and she has not heard from anyone.   Patients callback #: [phone]

## 2023-04-05 NOTE — Telephone Encounter (Signed)
Patient called back during lunch and left a voicemail stating she would love to have a nurse or someone please call her back.

## 2023-04-05 NOTE — Telephone Encounter (Signed)
[facility] told patient that they did not have a referral from [place]. She asked that we re-send the referral.

## 2023-06-18 IMAGING — MG MM DIGITAL SCREENING BILAT W/ TOMO AND CAD
8 series · 9 of 24 positions shown · non-contrast
Comparison: Previous exam(s).

CLINICAL DATA: Screening.

EXAM:
DIGITAL SCREENING BILATERAL MAMMOGRAM WITH TOMOSYNTHESIS AND CAD
TECHNIQUE: Bilateral screening digital craniocaudal and mediolateral oblique
mammograms were obtained. Bilateral screening digital breast
tomosynthesis was performed. The images were evaluated with
computer-aided detection.

[R MLO synth-2D]
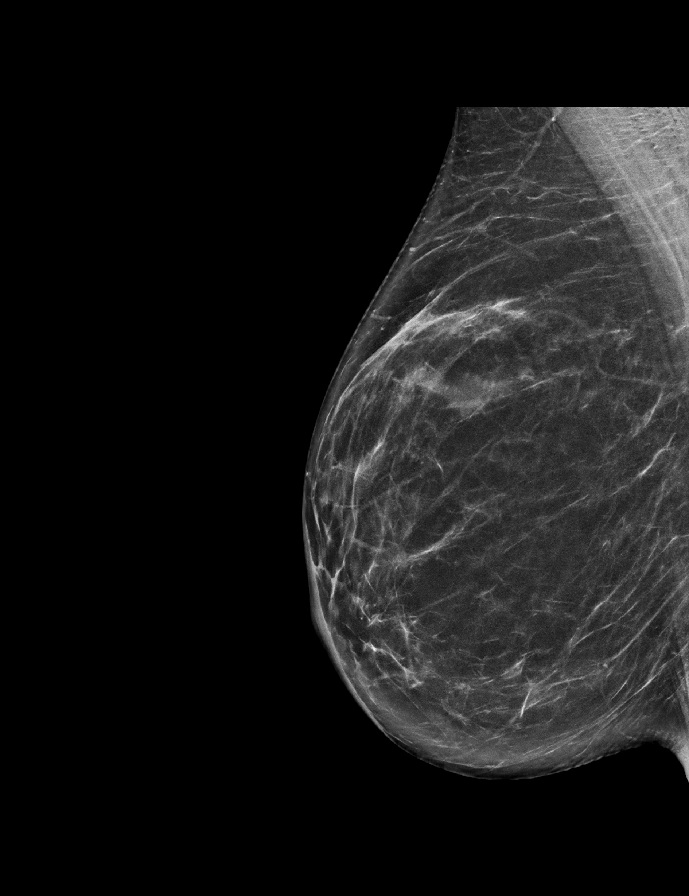

[L CC synth-2D]
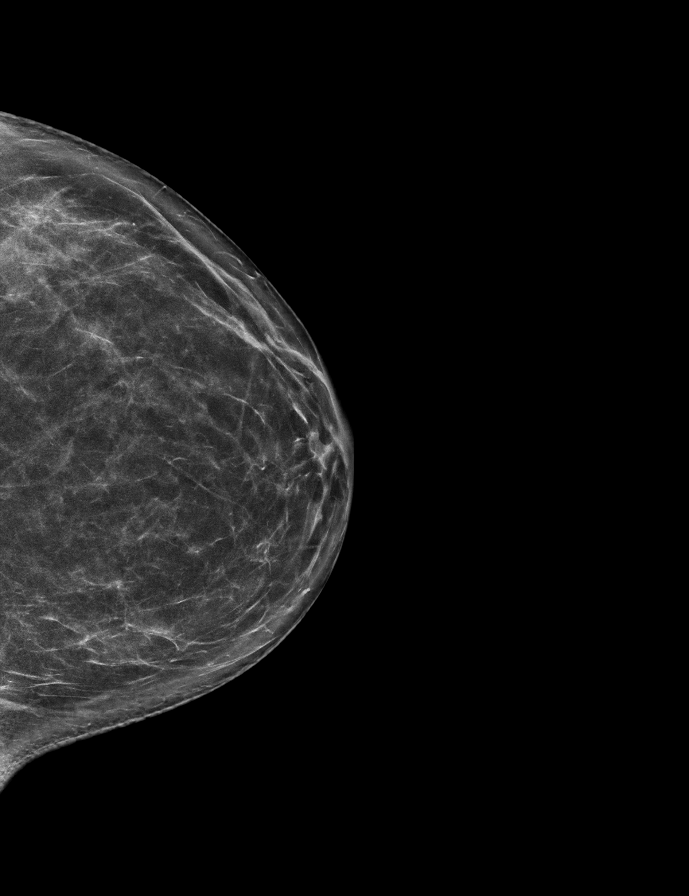

[R CC synth-2D]
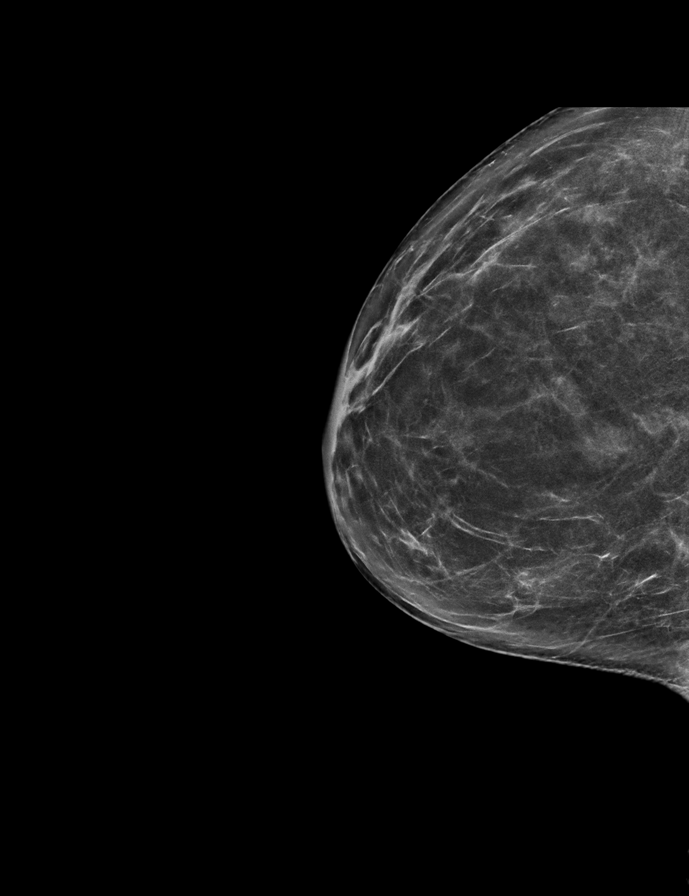

[L MLO synth-2D]
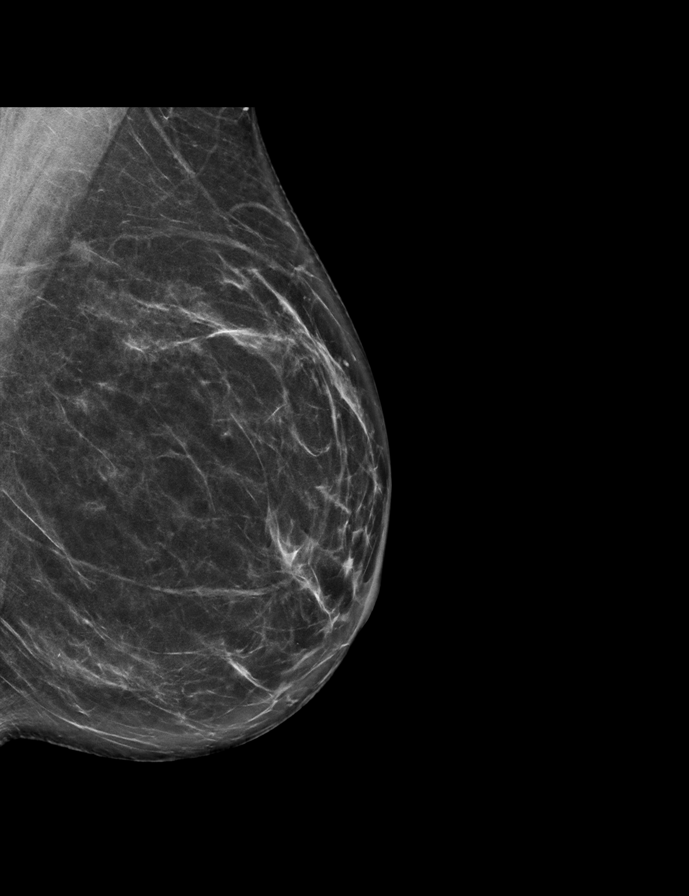

[R MLO tomo · 2 of 70 frames shown]
[frame 23/70]
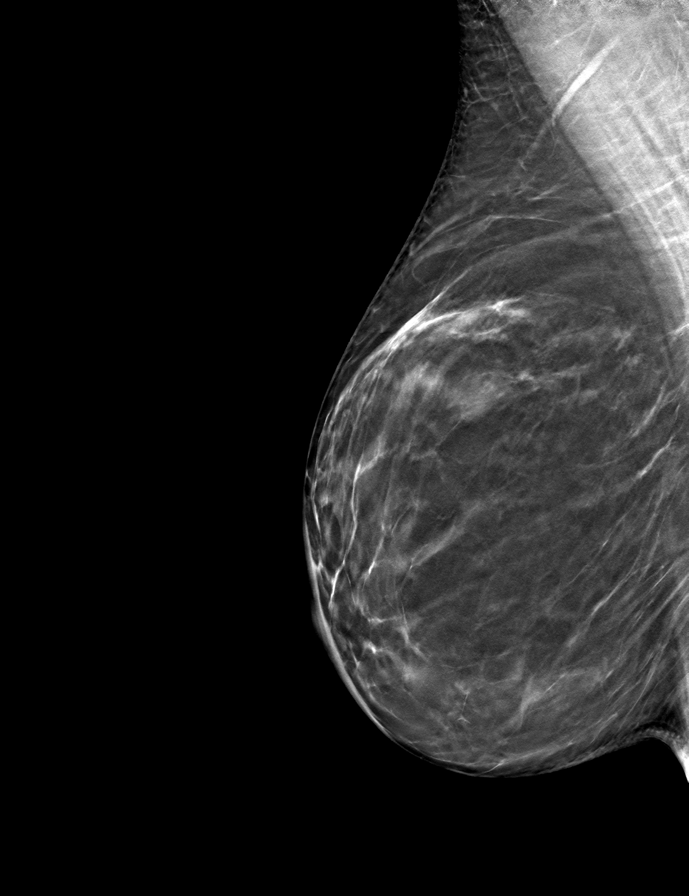
[frame 35/70]
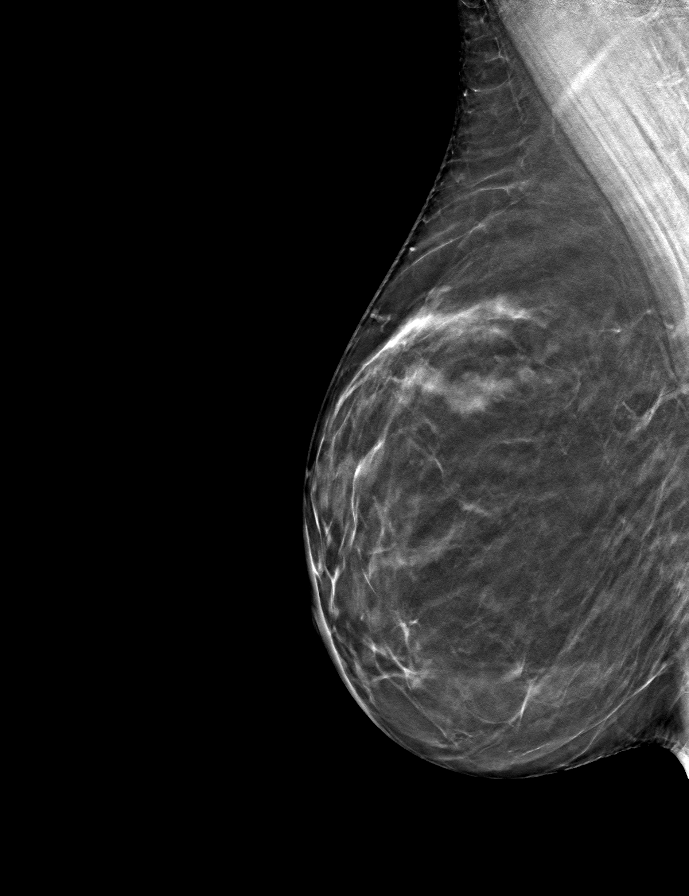

[R CC tomo · tomo slice 33/65.0]
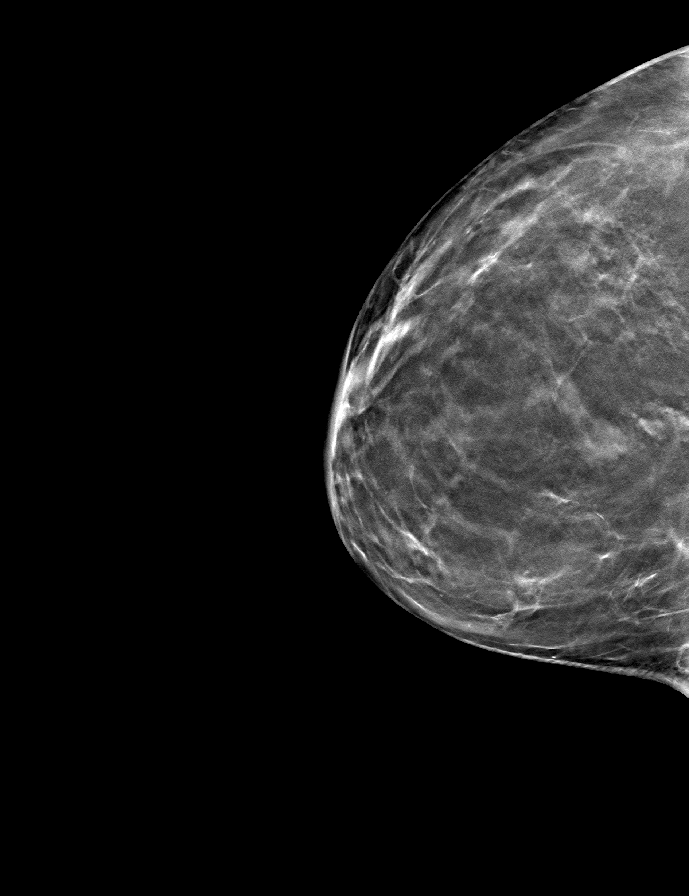

[L CC tomo · tomo slice 33/65.0]
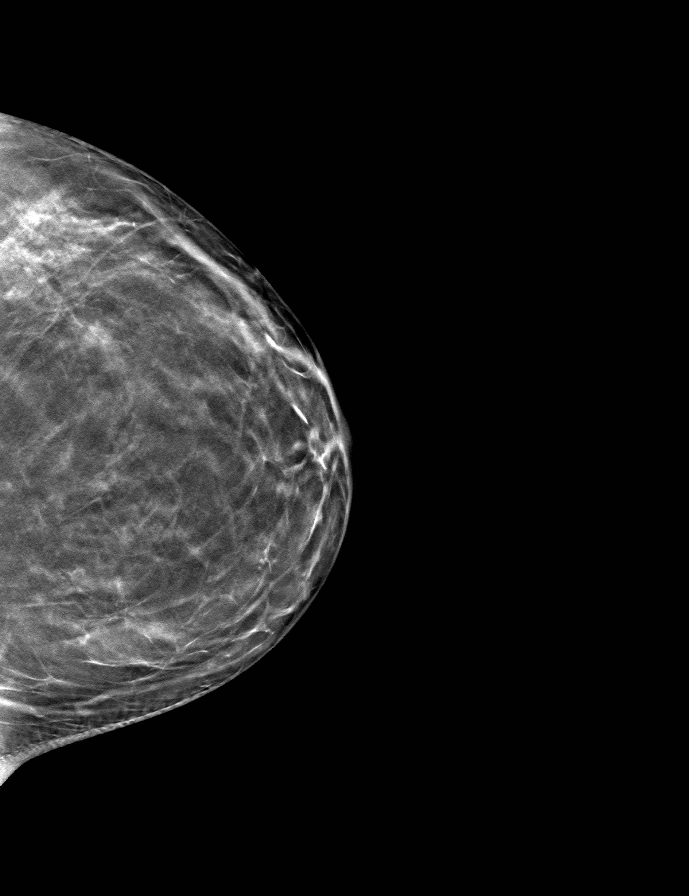

[L MLO tomo · tomo slice 36/71.0]
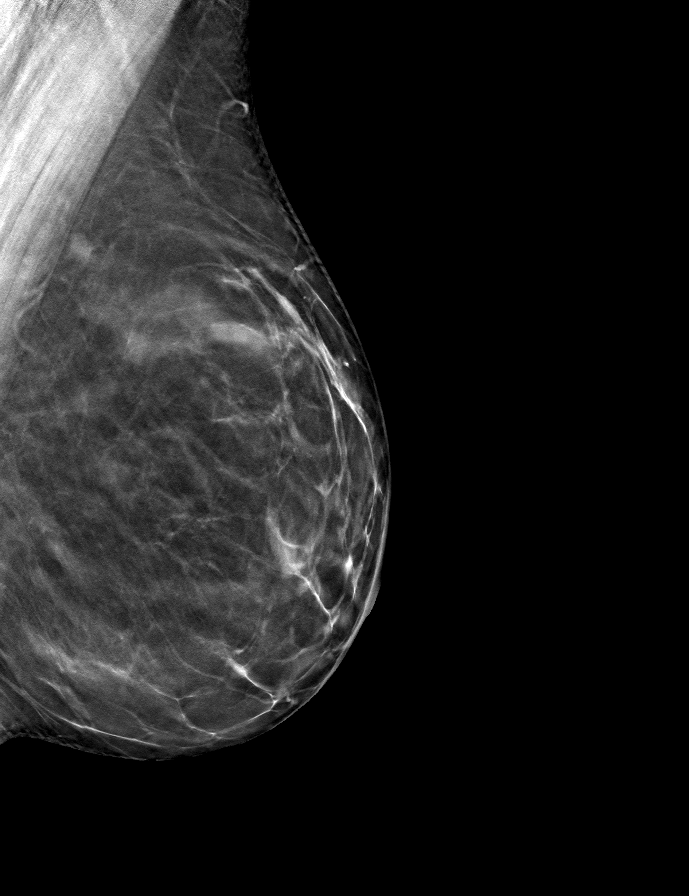

[9 of 24 positions shown; findings below may reference images not displayed]

ACR Breast Density Category b: There are scattered areas of
fibroglandular density.
FINDINGS: There are no findings suspicious for malignancy.
IMPRESSION: No mammographic evidence of malignancy. A result letter of this
screening mammogram will be mailed directly to the patient.

RECOMMENDATION:
Screening mammogram in one year. (Code:51-O-LD2)

BI-RADS CATEGORY  1: Negative.

## 2024-04-27 ENCOUNTER — Encounter: Payer: Self-pay | Admitting: Podiatry

## 2024-04-27 ENCOUNTER — Ambulatory Visit (INDEPENDENT_AMBULATORY_CARE_PROVIDER_SITE_OTHER)

## 2024-04-27 ENCOUNTER — Ambulatory Visit: Admitting: Podiatry

## 2024-04-27 DIAGNOSIS — M722 Plantar fascial fibromatosis: Secondary | ICD-10-CM

## 2024-04-27 DIAGNOSIS — G5751 Tarsal tunnel syndrome, right lower limb: Secondary | ICD-10-CM

## 2024-04-27 DIAGNOSIS — M7731 Calcaneal spur, right foot: Secondary | ICD-10-CM

## 2024-04-27 MED ORDER — TRIAMCINOLONE ACETONIDE 10 MG/ML IJ SUSP: 10.0000 mg | Freq: Once | INTRAMUSCULAR | Status: AC

## 2024-04-27 MED ORDER — MELOXICAM 15 MG PO TABS: 15.0000 mg | ORAL_TABLET | Freq: Every day | ORAL | 0 refills | Status: AC

## 2024-04-27 NOTE — Patient Instructions (Addendum)

## 2024-04-27 NOTE — Progress Notes (Signed)
 Chief Complaint  Patient presents with   Heel Spurs    Rm17 Pain in right heel that radiates up leg started 3 months ago/aching,throbbing and sharp pains.    HPI: 53 y.o. female presenting today with c/o pain in the bottom of the right heel.  Pain has been going on for 3 months.  She endorses that it radiates up the leg.  She does endorse history of low back pain and sciatica.  Had previously dealt with pain like this around a year ago and it has recurred.  Past Medical History:  Diagnosis Date   Chronic kidney disease    stones   Depression    Endometriosis    Neuromuscular disorder (HCC)    migraines   Thyroid disease    Past Surgical History:  Procedure Laterality Date   ABDOMINAL HYSTERECTOMY     No Known Allergies   Physical Exam: General: The patient is alert and oriented x3 in no acute distress.  Dermatology:  No ecchymosis, erythema, or edema bilateral.  No open lesions.    Vascular: Palpable pedal pulses bilaterally. Capillary refill within normal limits.  No appreciable edema.    Neurological: Epicritic sensation is intact  Musculoskeletal Exam:  There is pain on palpation of the plantarmedial & plantarcentral aspect of right heel.  No gaps or nodules within the plantar fascia.  Positive Windlass mechanism bilateral.  Antalgic gait noted with first few steps upon standing.  No pain on palpation of achilles tendon bilateral.  Ankle df less than 10 degrees with knee extended b/l.  There is some pain to the lateral column on palpation.  Does have subjective radiating pain up the lateral foot and leg.  Does have positive Tinel's sign.  Radiographic Exam: Right foot radiographs 3 views 04/27/2024 Normal osseous mineralization. Joint spaces preserved.  No fractures noted.  Cavus foot type noted, mild posterior calcaneal spurring noted.  Assessment/Plan of Care: 1. Plantar fasciitis of right foot   2. Tarsal tunnel syndrome of right side     Meds ordered this encounter   Medications   triamcinolone  acetonide (KENALOG ) 10 MG/ML injection 10 mg   meloxicam  (MOBIC ) 15 MG tablet    Sig: Take 1 tablet (15 mg total) by mouth daily.    Dispense:  30 tablet    Refill:  0   DG FOOT COMPLETE RIGHT  -Reviewed etiology of plantar fasciitis with patient.  Discussed treatment options with patient today, including cortisone injection, NSAID course of treatment, stretching exercises, physical therapy, use of night splint, rest, icing the heel, arch supports/orthotics, and supportive shoe gear.  Did discuss tarsal tunnel component and also concurrent back pain and that this can also be an ongoing source of the chronic heel pain.  With the patient's verbal consent, a corticosteroid injection was administered to the right heel, consisting of a mixture of 1% lidocaine plain, 0.5% Sensorcaine plain, and Kenalog -10 for a total of 2.0 cc administered.  A Band-aid was applied.  Patient tolerated well.  Given long-term right heel pain which has been recurrent, we will start with also discussed stretching regimen at length with written instructions provided.  She may come out of the cam boot to perform stretching regimen.  Recommend the use of an even up.  Return in about 3 weeks (around 05/18/2024) for Plantar Fasciitis.   Romar Woodrick L. Lunda Salines, AACFAS [facility]     [address]  [place], [place] [address]                Office [phone]  Fax [phone]

## 2024-05-18 ENCOUNTER — Telehealth: Payer: Self-pay | Admitting: Podiatry

## 2024-05-18 ENCOUNTER — Encounter: Payer: Self-pay | Admitting: Podiatry

## 2024-05-18 ENCOUNTER — Ambulatory Visit (INDEPENDENT_AMBULATORY_CARE_PROVIDER_SITE_OTHER): Admitting: Podiatry

## 2024-05-18 DIAGNOSIS — M722 Plantar fascial fibromatosis: Secondary | ICD-10-CM | POA: Diagnosis not present

## 2024-05-18 DIAGNOSIS — M5416 Radiculopathy, lumbar region: Secondary | ICD-10-CM | POA: Diagnosis not present

## 2024-05-18 MED ORDER — METHYLPREDNISOLONE 4 MG PO TBPK: ORAL_TABLET | ORAL | 0 refills | Status: DC

## 2024-05-18 MED ORDER — METHYLPREDNISOLONE 4 MG PO TBPK: ORAL_TABLET | ORAL | 0 refills | Status: AC

## 2024-05-18 NOTE — Progress Notes (Unsigned)
 Chief Complaint  Patient presents with   Plantar Fasciitis    Pt is here for a follow up of right foot plantar fascitis pt stated that she wears her boot and it does help she stated that she feels the discomfort more when she has been walking on her foot for a long period of time.she stated that she still gets the shooting pain that goes up her leg.    HPI: 53 y.o. female presenting today following up for right heel pain.  Plantar fasciitis symptoms also in the setting of chronic back pain and radiculopathy.  She does report significant improvement to pain to the plantar medial heel following injection and use of cam boot.  She continues to endorse pain radiating to the outer foot and ankle up the leg and into her back.  Past Medical History:  Diagnosis Date   Chronic kidney disease    stones   Depression    Endometriosis    Neuromuscular disorder (HCC)    migraines   Thyroid disease    Past Surgical History:  Procedure Laterality Date   ABDOMINAL HYSTERECTOMY     No Known Allergies   Physical Exam: General: The patient is alert and oriented x3 in no acute distress.  Dermatology:  No ecchymosis, erythema, or edema bilateral.  No open lesions.    Vascular: Palpable pedal pulses bilaterally. Capillary refill within normal limits.  No appreciable edema.    Neurological: Epicritic sensation is intact.  Pain with single-leg rise right leg with reproducible pain radiating to back.  Musculoskeletal Exam:  There is decreased pain on palpation of the plantarmedial & plantarcentral aspect of right heel.  No gaps or nodules within the plantar fascia.  Positive Windlass mechanism bilateral.  Antalgic gait noted with first few steps upon standing.  No pain on palpation of achilles tendon bilateral.  Ankle df less than 10 degrees with knee extended b/l.  There is some pain to the lateral column on palpation.  Does have subjective radiating pain up the lateral foot and leg.  Does have positive  Tinel's sign.  Radiographic Exam: Right foot radiographs 3 views 04/27/2024 Normal osseous mineralization. Joint spaces preserved.  No fractures noted.  Cavus foot type noted, mild posterior calcaneal spurring noted.  Assessment/Plan of Care: 1. Plantar fasciitis of right foot   2. Lumbar radiculopathy     Meds ordered this encounter  Medications   DISCONTD: methylPREDNISolone  (MEDROL  DOSEPAK) 4 MG TBPK tablet    Sig: 6 Day Tapering Dose    Dispense:  21 tablet    Refill:  0   methylPREDNISolone  (MEDROL  DOSEPAK) 4 MG TBPK tablet    Sig: 6 Day Tapering Dose    Dispense:  21 tablet    Refill:  0   AMB REFERRAL TO NEUROSURGERY  - Clinical findings and treatment plan reviewed with patient.  She has had some improvement to the plantar fasciitis symptoms.  Discussed continued stretching, can progress out of the cam boot as tolerated, discussed the use of good supportive shoes and over-the-counter inserts.  Discussed that her findings also seem very indicative of lower extremity radiculopathy and sciatica.  Did discuss home exercises that she can initiate which may alleviate some of her symptoms.  I think she would benefit from seeing a neurosurgeon.  She also relates similar symptoms to the right arm.  Patient to complete course of oral meloxicam .  Will add Medrol  Dosepak as well.  Can follow-up as needed for the right heel pain, lower  extremity etiology can certainly be playing a concurrent role however feel strongly that the back pain is contributing to the chronic nature of her symptoms.  Return if symptoms worsen or fail to improve, for Heel pain.   Katelyn Davis L. Lamount MAUL, AACFAS [facility]     [address] [place], [place] [address]                Office [phone]  Fax [phone]

## 2024-05-18 NOTE — Patient Instructions (Signed)

## 2024-05-18 NOTE — Telephone Encounter (Signed)
 Patient
